# Patient Record
Sex: Female | Born: 1960 | Race: White | Hispanic: No | Marital: Single | Smoking: Never smoker
Health system: Southern US, Community
[De-identification: ages and names within clinical notes are randomized; demographics above are authoritative.]

## PROBLEM LIST (undated history)

## (undated) DIAGNOSIS — N201 Calculus of ureter: Secondary | ICD-10-CM

## (undated) DIAGNOSIS — I10 Essential (primary) hypertension: Secondary | ICD-10-CM

## (undated) DIAGNOSIS — Z8639 Personal history of other endocrine, nutritional and metabolic disease: Secondary | ICD-10-CM

## (undated) DIAGNOSIS — E119 Type 2 diabetes mellitus without complications: Secondary | ICD-10-CM

## (undated) HISTORY — PX: TONSILLECTOMY: SUR1361

## (undated) HISTORY — PX: TUBAL LIGATION: SHX77

## (undated) HISTORY — PX: JOINT REPLACEMENT: SHX530

## (undated) HISTORY — PX: CHOLECYSTECTOMY: SHX55

---

## 2012-02-11 ENCOUNTER — Other Ambulatory Visit: Payer: Self-pay | Admitting: Family Medicine

## 2012-02-11 ENCOUNTER — Ambulatory Visit
Admission: RE | Admit: 2012-02-11 | Discharge: 2012-02-11 | Disposition: A | Discharge status: Home / Self Care | Payer: Managed Care, Other (non HMO) | Source: Ambulatory Visit | Attending: Family Medicine | Admitting: Family Medicine

## 2012-02-11 DIAGNOSIS — M549 Dorsalgia, unspecified: Secondary | ICD-10-CM

## 2016-01-07 DIAGNOSIS — M75111 Incomplete rotator cuff tear or rupture of right shoulder, not specified as traumatic: Secondary | ICD-10-CM | POA: Diagnosis not present

## 2016-02-05 DIAGNOSIS — M75111 Incomplete rotator cuff tear or rupture of right shoulder, not specified as traumatic: Secondary | ICD-10-CM | POA: Diagnosis not present

## 2016-04-20 DIAGNOSIS — R05 Cough: Secondary | ICD-10-CM | POA: Diagnosis not present

## 2016-04-20 DIAGNOSIS — I10 Essential (primary) hypertension: Secondary | ICD-10-CM | POA: Diagnosis not present

## 2016-05-14 DIAGNOSIS — E119 Type 2 diabetes mellitus without complications: Secondary | ICD-10-CM | POA: Diagnosis not present

## 2016-05-14 DIAGNOSIS — E7800 Pure hypercholesterolemia, unspecified: Secondary | ICD-10-CM | POA: Diagnosis not present

## 2016-05-14 DIAGNOSIS — I10 Essential (primary) hypertension: Secondary | ICD-10-CM | POA: Diagnosis not present

## 2016-06-17 DIAGNOSIS — M1712 Unilateral primary osteoarthritis, left knee: Secondary | ICD-10-CM | POA: Diagnosis not present

## 2016-06-18 DIAGNOSIS — Z6841 Body Mass Index (BMI) 40.0 and over, adult: Secondary | ICD-10-CM | POA: Diagnosis not present

## 2016-06-18 DIAGNOSIS — I10 Essential (primary) hypertension: Secondary | ICD-10-CM | POA: Diagnosis not present

## 2016-06-18 DIAGNOSIS — E119 Type 2 diabetes mellitus without complications: Secondary | ICD-10-CM | POA: Diagnosis not present

## 2016-06-18 DIAGNOSIS — E785 Hyperlipidemia, unspecified: Secondary | ICD-10-CM | POA: Diagnosis not present

## 2016-07-14 DIAGNOSIS — Z136 Encounter for screening for cardiovascular disorders: Secondary | ICD-10-CM | POA: Diagnosis not present

## 2016-07-14 DIAGNOSIS — Z6841 Body Mass Index (BMI) 40.0 and over, adult: Secondary | ICD-10-CM | POA: Diagnosis not present

## 2016-07-14 DIAGNOSIS — I10 Essential (primary) hypertension: Secondary | ICD-10-CM | POA: Diagnosis not present

## 2016-07-14 DIAGNOSIS — E785 Hyperlipidemia, unspecified: Secondary | ICD-10-CM | POA: Diagnosis not present

## 2016-07-14 DIAGNOSIS — E119 Type 2 diabetes mellitus without complications: Secondary | ICD-10-CM | POA: Diagnosis not present

## 2016-07-20 DIAGNOSIS — R918 Other nonspecific abnormal finding of lung field: Secondary | ICD-10-CM | POA: Diagnosis not present

## 2016-07-20 DIAGNOSIS — R938 Abnormal findings on diagnostic imaging of other specified body structures: Secondary | ICD-10-CM | POA: Diagnosis not present

## 2016-08-11 DIAGNOSIS — E119 Type 2 diabetes mellitus without complications: Secondary | ICD-10-CM | POA: Diagnosis not present

## 2016-08-11 DIAGNOSIS — I10 Essential (primary) hypertension: Secondary | ICD-10-CM | POA: Diagnosis not present

## 2016-08-11 DIAGNOSIS — Z713 Dietary counseling and surveillance: Secondary | ICD-10-CM | POA: Diagnosis not present

## 2016-08-11 DIAGNOSIS — E7800 Pure hypercholesterolemia, unspecified: Secondary | ICD-10-CM | POA: Diagnosis not present

## 2016-08-13 DIAGNOSIS — Z6841 Body Mass Index (BMI) 40.0 and over, adult: Secondary | ICD-10-CM | POA: Diagnosis not present

## 2016-08-13 DIAGNOSIS — Z713 Dietary counseling and surveillance: Secondary | ICD-10-CM | POA: Diagnosis not present

## 2016-09-18 DIAGNOSIS — Z713 Dietary counseling and surveillance: Secondary | ICD-10-CM | POA: Diagnosis not present

## 2016-09-18 DIAGNOSIS — Z6841 Body Mass Index (BMI) 40.0 and over, adult: Secondary | ICD-10-CM | POA: Diagnosis not present

## 2016-10-01 DIAGNOSIS — Z6841 Body Mass Index (BMI) 40.0 and over, adult: Secondary | ICD-10-CM | POA: Diagnosis not present

## 2016-10-01 DIAGNOSIS — Z713 Dietary counseling and surveillance: Secondary | ICD-10-CM | POA: Diagnosis not present

## 2016-10-26 DIAGNOSIS — F54 Psychological and behavioral factors associated with disorders or diseases classified elsewhere: Secondary | ICD-10-CM | POA: Diagnosis not present

## 2016-10-26 DIAGNOSIS — Z7189 Other specified counseling: Secondary | ICD-10-CM | POA: Diagnosis not present

## 2016-10-29 DIAGNOSIS — E119 Type 2 diabetes mellitus without complications: Secondary | ICD-10-CM | POA: Diagnosis not present

## 2016-10-29 DIAGNOSIS — Z6841 Body Mass Index (BMI) 40.0 and over, adult: Secondary | ICD-10-CM | POA: Diagnosis not present

## 2016-10-29 DIAGNOSIS — I10 Essential (primary) hypertension: Secondary | ICD-10-CM | POA: Diagnosis not present

## 2016-10-29 DIAGNOSIS — Z96651 Presence of right artificial knee joint: Secondary | ICD-10-CM | POA: Diagnosis not present

## 2016-11-17 DIAGNOSIS — E785 Hyperlipidemia, unspecified: Secondary | ICD-10-CM | POA: Diagnosis not present

## 2016-11-17 DIAGNOSIS — Z6841 Body Mass Index (BMI) 40.0 and over, adult: Secondary | ICD-10-CM | POA: Diagnosis not present

## 2016-11-17 DIAGNOSIS — E119 Type 2 diabetes mellitus without complications: Secondary | ICD-10-CM | POA: Diagnosis not present

## 2016-11-17 DIAGNOSIS — I10 Essential (primary) hypertension: Secondary | ICD-10-CM | POA: Diagnosis not present

## 2016-11-17 DIAGNOSIS — Z01818 Encounter for other preprocedural examination: Secondary | ICD-10-CM | POA: Diagnosis not present

## 2016-11-18 DIAGNOSIS — I10 Essential (primary) hypertension: Secondary | ICD-10-CM | POA: Diagnosis not present

## 2016-11-18 DIAGNOSIS — Z01818 Encounter for other preprocedural examination: Secondary | ICD-10-CM | POA: Diagnosis not present

## 2016-11-18 DIAGNOSIS — Z6841 Body Mass Index (BMI) 40.0 and over, adult: Secondary | ICD-10-CM | POA: Diagnosis not present

## 2016-11-18 DIAGNOSIS — Z96651 Presence of right artificial knee joint: Secondary | ICD-10-CM | POA: Diagnosis not present

## 2016-11-18 DIAGNOSIS — K219 Gastro-esophageal reflux disease without esophagitis: Secondary | ICD-10-CM | POA: Diagnosis not present

## 2016-11-18 DIAGNOSIS — Z79899 Other long term (current) drug therapy: Secondary | ICD-10-CM | POA: Diagnosis not present

## 2016-11-18 DIAGNOSIS — E11618 Type 2 diabetes mellitus with other diabetic arthropathy: Secondary | ICD-10-CM | POA: Diagnosis not present

## 2016-11-18 DIAGNOSIS — Z794 Long term (current) use of insulin: Secondary | ICD-10-CM | POA: Diagnosis not present

## 2016-11-18 DIAGNOSIS — Z791 Long term (current) use of non-steroidal anti-inflammatories (NSAID): Secondary | ICD-10-CM | POA: Diagnosis not present

## 2016-11-18 DIAGNOSIS — Z9049 Acquired absence of other specified parts of digestive tract: Secondary | ICD-10-CM | POA: Diagnosis not present

## 2016-11-18 DIAGNOSIS — E785 Hyperlipidemia, unspecified: Secondary | ICD-10-CM | POA: Diagnosis not present

## 2016-11-22 HISTORY — PX: GASTRIC BYPASS: SHX52

## 2016-11-30 DIAGNOSIS — Z96651 Presence of right artificial knee joint: Secondary | ICD-10-CM | POA: Diagnosis not present

## 2016-11-30 DIAGNOSIS — K660 Peritoneal adhesions (postprocedural) (postinfection): Secondary | ICD-10-CM | POA: Diagnosis not present

## 2016-11-30 DIAGNOSIS — Z6841 Body Mass Index (BMI) 40.0 and over, adult: Secondary | ICD-10-CM | POA: Diagnosis not present

## 2016-11-30 DIAGNOSIS — Z791 Long term (current) use of non-steroidal anti-inflammatories (NSAID): Secondary | ICD-10-CM | POA: Diagnosis not present

## 2016-11-30 DIAGNOSIS — Z79899 Other long term (current) drug therapy: Secondary | ICD-10-CM | POA: Diagnosis not present

## 2016-11-30 DIAGNOSIS — I10 Essential (primary) hypertension: Secondary | ICD-10-CM | POA: Diagnosis not present

## 2016-11-30 DIAGNOSIS — E119 Type 2 diabetes mellitus without complications: Secondary | ICD-10-CM | POA: Diagnosis not present

## 2016-11-30 DIAGNOSIS — E785 Hyperlipidemia, unspecified: Secondary | ICD-10-CM | POA: Diagnosis not present

## 2016-12-08 DIAGNOSIS — E119 Type 2 diabetes mellitus without complications: Secondary | ICD-10-CM | POA: Diagnosis not present

## 2016-12-08 DIAGNOSIS — I10 Essential (primary) hypertension: Secondary | ICD-10-CM | POA: Diagnosis not present

## 2016-12-08 DIAGNOSIS — E7800 Pure hypercholesterolemia, unspecified: Secondary | ICD-10-CM | POA: Diagnosis not present

## 2016-12-10 DIAGNOSIS — E860 Dehydration: Secondary | ICD-10-CM | POA: Diagnosis not present

## 2016-12-17 DIAGNOSIS — Z6841 Body Mass Index (BMI) 40.0 and over, adult: Secondary | ICD-10-CM | POA: Diagnosis not present

## 2016-12-17 DIAGNOSIS — Z713 Dietary counseling and surveillance: Secondary | ICD-10-CM | POA: Diagnosis not present

## 2017-02-18 DIAGNOSIS — I10 Essential (primary) hypertension: Secondary | ICD-10-CM | POA: Diagnosis not present

## 2017-02-18 DIAGNOSIS — R001 Bradycardia, unspecified: Secondary | ICD-10-CM | POA: Diagnosis not present

## 2017-03-01 DIAGNOSIS — Z713 Dietary counseling and surveillance: Secondary | ICD-10-CM | POA: Diagnosis not present

## 2017-03-01 DIAGNOSIS — Z6841 Body Mass Index (BMI) 40.0 and over, adult: Secondary | ICD-10-CM | POA: Diagnosis not present

## 2017-03-18 DIAGNOSIS — E7800 Pure hypercholesterolemia, unspecified: Secondary | ICD-10-CM | POA: Diagnosis not present

## 2017-03-18 DIAGNOSIS — E1165 Type 2 diabetes mellitus with hyperglycemia: Secondary | ICD-10-CM | POA: Diagnosis not present

## 2017-03-18 DIAGNOSIS — K912 Postsurgical malabsorption, not elsewhere classified: Secondary | ICD-10-CM | POA: Diagnosis not present

## 2017-03-18 DIAGNOSIS — Z0000 Encounter for general adult medical examination without abnormal findings: Secondary | ICD-10-CM | POA: Diagnosis not present

## 2017-04-07 DIAGNOSIS — M1712 Unilateral primary osteoarthritis, left knee: Secondary | ICD-10-CM | POA: Diagnosis not present

## 2017-05-17 DIAGNOSIS — K912 Postsurgical malabsorption, not elsewhere classified: Secondary | ICD-10-CM | POA: Diagnosis not present

## 2017-05-17 DIAGNOSIS — Z9884 Bariatric surgery status: Secondary | ICD-10-CM | POA: Diagnosis not present

## 2017-06-10 DIAGNOSIS — I10 Essential (primary) hypertension: Secondary | ICD-10-CM | POA: Diagnosis not present

## 2017-06-10 DIAGNOSIS — E7800 Pure hypercholesterolemia, unspecified: Secondary | ICD-10-CM | POA: Diagnosis not present

## 2017-06-10 DIAGNOSIS — E1169 Type 2 diabetes mellitus with other specified complication: Secondary | ICD-10-CM | POA: Diagnosis not present

## 2017-06-30 DIAGNOSIS — E785 Hyperlipidemia, unspecified: Secondary | ICD-10-CM | POA: Diagnosis not present

## 2017-06-30 DIAGNOSIS — I10 Essential (primary) hypertension: Secondary | ICD-10-CM | POA: Diagnosis not present

## 2017-06-30 DIAGNOSIS — Z9884 Bariatric surgery status: Secondary | ICD-10-CM | POA: Diagnosis not present

## 2017-06-30 DIAGNOSIS — E119 Type 2 diabetes mellitus without complications: Secondary | ICD-10-CM | POA: Diagnosis not present

## 2017-07-12 DIAGNOSIS — I10 Essential (primary) hypertension: Secondary | ICD-10-CM | POA: Diagnosis not present

## 2017-07-12 DIAGNOSIS — Z01818 Encounter for other preprocedural examination: Secondary | ICD-10-CM | POA: Diagnosis not present

## 2017-07-12 DIAGNOSIS — Z8639 Personal history of other endocrine, nutritional and metabolic disease: Secondary | ICD-10-CM | POA: Diagnosis not present

## 2017-07-12 DIAGNOSIS — M1712 Unilateral primary osteoarthritis, left knee: Secondary | ICD-10-CM | POA: Diagnosis not present

## 2017-07-12 DIAGNOSIS — Z79899 Other long term (current) drug therapy: Secondary | ICD-10-CM | POA: Diagnosis not present

## 2017-07-12 DIAGNOSIS — Z9884 Bariatric surgery status: Secondary | ICD-10-CM | POA: Diagnosis not present

## 2017-07-12 DIAGNOSIS — Z87898 Personal history of other specified conditions: Secondary | ICD-10-CM | POA: Diagnosis not present

## 2017-07-23 DIAGNOSIS — R0789 Other chest pain: Secondary | ICD-10-CM | POA: Diagnosis not present

## 2017-07-23 DIAGNOSIS — E119 Type 2 diabetes mellitus without complications: Secondary | ICD-10-CM | POA: Diagnosis not present

## 2017-07-23 DIAGNOSIS — Z6831 Body mass index (BMI) 31.0-31.9, adult: Secondary | ICD-10-CM | POA: Diagnosis not present

## 2017-07-23 DIAGNOSIS — I959 Hypotension, unspecified: Secondary | ICD-10-CM | POA: Diagnosis not present

## 2017-07-23 DIAGNOSIS — Z9884 Bariatric surgery status: Secondary | ICD-10-CM | POA: Diagnosis not present

## 2017-07-23 DIAGNOSIS — R001 Bradycardia, unspecified: Secondary | ICD-10-CM | POA: Diagnosis not present

## 2017-07-23 DIAGNOSIS — R55 Syncope and collapse: Secondary | ICD-10-CM | POA: Diagnosis not present

## 2017-07-23 DIAGNOSIS — Z96652 Presence of left artificial knee joint: Secondary | ICD-10-CM | POA: Diagnosis not present

## 2017-07-23 DIAGNOSIS — Z79899 Other long term (current) drug therapy: Secondary | ICD-10-CM | POA: Diagnosis not present

## 2017-07-23 DIAGNOSIS — E669 Obesity, unspecified: Secondary | ICD-10-CM | POA: Diagnosis not present

## 2017-07-23 DIAGNOSIS — R079 Chest pain, unspecified: Secondary | ICD-10-CM | POA: Diagnosis not present

## 2017-07-23 DIAGNOSIS — Z96651 Presence of right artificial knee joint: Secondary | ICD-10-CM | POA: Diagnosis not present

## 2017-07-23 DIAGNOSIS — Z471 Aftercare following joint replacement surgery: Secondary | ICD-10-CM | POA: Diagnosis not present

## 2017-07-23 DIAGNOSIS — M1712 Unilateral primary osteoarthritis, left knee: Secondary | ICD-10-CM | POA: Diagnosis not present

## 2017-07-23 DIAGNOSIS — E785 Hyperlipidemia, unspecified: Secondary | ICD-10-CM | POA: Diagnosis not present

## 2017-07-23 DIAGNOSIS — I10 Essential (primary) hypertension: Secondary | ICD-10-CM | POA: Diagnosis not present

## 2017-07-24 DIAGNOSIS — R079 Chest pain, unspecified: Secondary | ICD-10-CM | POA: Diagnosis not present

## 2017-07-24 DIAGNOSIS — I10 Essential (primary) hypertension: Secondary | ICD-10-CM | POA: Diagnosis not present

## 2017-07-24 DIAGNOSIS — R55 Syncope and collapse: Secondary | ICD-10-CM | POA: Diagnosis not present

## 2017-07-24 DIAGNOSIS — M1712 Unilateral primary osteoarthritis, left knee: Secondary | ICD-10-CM | POA: Diagnosis not present

## 2017-07-25 DIAGNOSIS — M1712 Unilateral primary osteoarthritis, left knee: Secondary | ICD-10-CM | POA: Diagnosis not present

## 2017-07-25 DIAGNOSIS — R55 Syncope and collapse: Secondary | ICD-10-CM | POA: Diagnosis not present

## 2017-07-25 DIAGNOSIS — I10 Essential (primary) hypertension: Secondary | ICD-10-CM | POA: Diagnosis not present

## 2017-07-25 DIAGNOSIS — R079 Chest pain, unspecified: Secondary | ICD-10-CM | POA: Diagnosis not present

## 2017-07-26 DIAGNOSIS — E119 Type 2 diabetes mellitus without complications: Secondary | ICD-10-CM | POA: Diagnosis not present

## 2017-07-26 DIAGNOSIS — Z96653 Presence of artificial knee joint, bilateral: Secondary | ICD-10-CM | POA: Diagnosis not present

## 2017-07-26 DIAGNOSIS — Z471 Aftercare following joint replacement surgery: Secondary | ICD-10-CM | POA: Diagnosis not present

## 2017-07-26 DIAGNOSIS — E785 Hyperlipidemia, unspecified: Secondary | ICD-10-CM | POA: Diagnosis not present

## 2017-07-26 DIAGNOSIS — I10 Essential (primary) hypertension: Secondary | ICD-10-CM | POA: Diagnosis not present

## 2017-07-28 DIAGNOSIS — Z471 Aftercare following joint replacement surgery: Secondary | ICD-10-CM | POA: Diagnosis not present

## 2017-07-28 DIAGNOSIS — E785 Hyperlipidemia, unspecified: Secondary | ICD-10-CM | POA: Diagnosis not present

## 2017-07-28 DIAGNOSIS — E119 Type 2 diabetes mellitus without complications: Secondary | ICD-10-CM | POA: Diagnosis not present

## 2017-07-28 DIAGNOSIS — I10 Essential (primary) hypertension: Secondary | ICD-10-CM | POA: Diagnosis not present

## 2017-07-28 DIAGNOSIS — Z96653 Presence of artificial knee joint, bilateral: Secondary | ICD-10-CM | POA: Diagnosis not present

## 2017-07-30 DIAGNOSIS — E119 Type 2 diabetes mellitus without complications: Secondary | ICD-10-CM | POA: Diagnosis not present

## 2017-07-30 DIAGNOSIS — E785 Hyperlipidemia, unspecified: Secondary | ICD-10-CM | POA: Diagnosis not present

## 2017-07-30 DIAGNOSIS — I10 Essential (primary) hypertension: Secondary | ICD-10-CM | POA: Diagnosis not present

## 2017-07-30 DIAGNOSIS — Z471 Aftercare following joint replacement surgery: Secondary | ICD-10-CM | POA: Diagnosis not present

## 2017-07-30 DIAGNOSIS — Z96653 Presence of artificial knee joint, bilateral: Secondary | ICD-10-CM | POA: Diagnosis not present

## 2017-08-03 DIAGNOSIS — E119 Type 2 diabetes mellitus without complications: Secondary | ICD-10-CM | POA: Diagnosis not present

## 2017-08-03 DIAGNOSIS — I10 Essential (primary) hypertension: Secondary | ICD-10-CM | POA: Diagnosis not present

## 2017-08-03 DIAGNOSIS — Z471 Aftercare following joint replacement surgery: Secondary | ICD-10-CM | POA: Diagnosis not present

## 2017-08-03 DIAGNOSIS — Z96653 Presence of artificial knee joint, bilateral: Secondary | ICD-10-CM | POA: Diagnosis not present

## 2017-08-03 DIAGNOSIS — E785 Hyperlipidemia, unspecified: Secondary | ICD-10-CM | POA: Diagnosis not present

## 2017-08-04 DIAGNOSIS — E119 Type 2 diabetes mellitus without complications: Secondary | ICD-10-CM | POA: Diagnosis not present

## 2017-08-04 DIAGNOSIS — Z471 Aftercare following joint replacement surgery: Secondary | ICD-10-CM | POA: Diagnosis not present

## 2017-08-04 DIAGNOSIS — I10 Essential (primary) hypertension: Secondary | ICD-10-CM | POA: Diagnosis not present

## 2017-08-04 DIAGNOSIS — Z96653 Presence of artificial knee joint, bilateral: Secondary | ICD-10-CM | POA: Diagnosis not present

## 2017-08-04 DIAGNOSIS — E785 Hyperlipidemia, unspecified: Secondary | ICD-10-CM | POA: Diagnosis not present

## 2017-08-06 DIAGNOSIS — E785 Hyperlipidemia, unspecified: Secondary | ICD-10-CM | POA: Diagnosis not present

## 2017-08-06 DIAGNOSIS — Z471 Aftercare following joint replacement surgery: Secondary | ICD-10-CM | POA: Diagnosis not present

## 2017-08-06 DIAGNOSIS — E119 Type 2 diabetes mellitus without complications: Secondary | ICD-10-CM | POA: Diagnosis not present

## 2017-08-06 DIAGNOSIS — Z96653 Presence of artificial knee joint, bilateral: Secondary | ICD-10-CM | POA: Diagnosis not present

## 2017-08-06 DIAGNOSIS — I10 Essential (primary) hypertension: Secondary | ICD-10-CM | POA: Diagnosis not present

## 2017-08-09 DIAGNOSIS — Z471 Aftercare following joint replacement surgery: Secondary | ICD-10-CM | POA: Diagnosis not present

## 2017-08-09 DIAGNOSIS — E119 Type 2 diabetes mellitus without complications: Secondary | ICD-10-CM | POA: Diagnosis not present

## 2017-08-09 DIAGNOSIS — E785 Hyperlipidemia, unspecified: Secondary | ICD-10-CM | POA: Diagnosis not present

## 2017-08-09 DIAGNOSIS — I10 Essential (primary) hypertension: Secondary | ICD-10-CM | POA: Diagnosis not present

## 2017-08-09 DIAGNOSIS — Z96653 Presence of artificial knee joint, bilateral: Secondary | ICD-10-CM | POA: Diagnosis not present

## 2017-08-11 DIAGNOSIS — I10 Essential (primary) hypertension: Secondary | ICD-10-CM | POA: Diagnosis not present

## 2017-08-11 DIAGNOSIS — Z471 Aftercare following joint replacement surgery: Secondary | ICD-10-CM | POA: Diagnosis not present

## 2017-08-11 DIAGNOSIS — Z96653 Presence of artificial knee joint, bilateral: Secondary | ICD-10-CM | POA: Diagnosis not present

## 2017-08-11 DIAGNOSIS — E785 Hyperlipidemia, unspecified: Secondary | ICD-10-CM | POA: Diagnosis not present

## 2017-08-11 DIAGNOSIS — E119 Type 2 diabetes mellitus without complications: Secondary | ICD-10-CM | POA: Diagnosis not present

## 2017-08-12 DIAGNOSIS — M25562 Pain in left knee: Secondary | ICD-10-CM | POA: Diagnosis not present

## 2017-08-12 DIAGNOSIS — R6889 Other general symptoms and signs: Secondary | ICD-10-CM | POA: Diagnosis not present

## 2017-08-18 DIAGNOSIS — M25562 Pain in left knee: Secondary | ICD-10-CM | POA: Diagnosis not present

## 2017-08-18 DIAGNOSIS — R6889 Other general symptoms and signs: Secondary | ICD-10-CM | POA: Diagnosis not present

## 2017-08-20 DIAGNOSIS — R6889 Other general symptoms and signs: Secondary | ICD-10-CM | POA: Diagnosis not present

## 2017-08-20 DIAGNOSIS — M25562 Pain in left knee: Secondary | ICD-10-CM | POA: Diagnosis not present

## 2017-08-25 DIAGNOSIS — M25562 Pain in left knee: Secondary | ICD-10-CM | POA: Diagnosis not present

## 2017-08-25 DIAGNOSIS — R6889 Other general symptoms and signs: Secondary | ICD-10-CM | POA: Diagnosis not present

## 2017-08-27 DIAGNOSIS — R6889 Other general symptoms and signs: Secondary | ICD-10-CM | POA: Diagnosis not present

## 2017-08-27 DIAGNOSIS — M25562 Pain in left knee: Secondary | ICD-10-CM | POA: Diagnosis not present

## 2017-08-31 DIAGNOSIS — R6889 Other general symptoms and signs: Secondary | ICD-10-CM | POA: Diagnosis not present

## 2017-08-31 DIAGNOSIS — M25562 Pain in left knee: Secondary | ICD-10-CM | POA: Diagnosis not present

## 2017-09-01 DIAGNOSIS — M25562 Pain in left knee: Secondary | ICD-10-CM | POA: Diagnosis not present

## 2017-09-02 DIAGNOSIS — R6889 Other general symptoms and signs: Secondary | ICD-10-CM | POA: Diagnosis not present

## 2017-09-02 DIAGNOSIS — M25562 Pain in left knee: Secondary | ICD-10-CM | POA: Diagnosis not present

## 2017-09-07 DIAGNOSIS — R6889 Other general symptoms and signs: Secondary | ICD-10-CM | POA: Diagnosis not present

## 2017-09-07 DIAGNOSIS — M25562 Pain in left knee: Secondary | ICD-10-CM | POA: Diagnosis not present

## 2017-09-09 DIAGNOSIS — M25562 Pain in left knee: Secondary | ICD-10-CM | POA: Diagnosis not present

## 2017-09-09 DIAGNOSIS — R6889 Other general symptoms and signs: Secondary | ICD-10-CM | POA: Diagnosis not present

## 2017-09-13 DIAGNOSIS — R6889 Other general symptoms and signs: Secondary | ICD-10-CM | POA: Diagnosis not present

## 2017-09-13 DIAGNOSIS — M25562 Pain in left knee: Secondary | ICD-10-CM | POA: Diagnosis not present

## 2017-09-15 DIAGNOSIS — R6889 Other general symptoms and signs: Secondary | ICD-10-CM | POA: Diagnosis not present

## 2017-09-15 DIAGNOSIS — M25562 Pain in left knee: Secondary | ICD-10-CM | POA: Diagnosis not present

## 2017-09-20 DIAGNOSIS — M25562 Pain in left knee: Secondary | ICD-10-CM | POA: Diagnosis not present

## 2017-09-20 DIAGNOSIS — R6889 Other general symptoms and signs: Secondary | ICD-10-CM | POA: Diagnosis not present

## 2017-09-22 DIAGNOSIS — R6889 Other general symptoms and signs: Secondary | ICD-10-CM | POA: Diagnosis not present

## 2017-09-22 DIAGNOSIS — M25562 Pain in left knee: Secondary | ICD-10-CM | POA: Diagnosis not present

## 2017-09-28 DIAGNOSIS — R6889 Other general symptoms and signs: Secondary | ICD-10-CM | POA: Diagnosis not present

## 2017-09-28 DIAGNOSIS — M25562 Pain in left knee: Secondary | ICD-10-CM | POA: Diagnosis not present

## 2017-09-30 DIAGNOSIS — R6889 Other general symptoms and signs: Secondary | ICD-10-CM | POA: Diagnosis not present

## 2017-09-30 DIAGNOSIS — M25562 Pain in left knee: Secondary | ICD-10-CM | POA: Diagnosis not present

## 2017-10-13 DIAGNOSIS — M25562 Pain in left knee: Secondary | ICD-10-CM | POA: Diagnosis not present

## 2017-10-25 DIAGNOSIS — Z96652 Presence of left artificial knee joint: Secondary | ICD-10-CM | POA: Diagnosis not present

## 2017-10-25 DIAGNOSIS — M25562 Pain in left knee: Secondary | ICD-10-CM | POA: Diagnosis not present

## 2017-10-25 DIAGNOSIS — E119 Type 2 diabetes mellitus without complications: Secondary | ICD-10-CM | POA: Diagnosis not present

## 2017-10-25 DIAGNOSIS — I10 Essential (primary) hypertension: Secondary | ICD-10-CM | POA: Diagnosis not present

## 2017-10-25 DIAGNOSIS — E785 Hyperlipidemia, unspecified: Secondary | ICD-10-CM | POA: Diagnosis not present

## 2017-10-25 DIAGNOSIS — Z79899 Other long term (current) drug therapy: Secondary | ICD-10-CM | POA: Diagnosis not present

## 2017-10-25 DIAGNOSIS — M24662 Ankylosis, left knee: Secondary | ICD-10-CM | POA: Diagnosis not present

## 2017-10-26 DIAGNOSIS — M24662 Ankylosis, left knee: Secondary | ICD-10-CM | POA: Diagnosis not present

## 2017-10-26 DIAGNOSIS — M25562 Pain in left knee: Secondary | ICD-10-CM | POA: Diagnosis not present

## 2017-10-26 DIAGNOSIS — Z96652 Presence of left artificial knee joint: Secondary | ICD-10-CM | POA: Diagnosis not present

## 2017-10-29 DIAGNOSIS — Z96652 Presence of left artificial knee joint: Secondary | ICD-10-CM | POA: Diagnosis not present

## 2017-10-29 DIAGNOSIS — M25562 Pain in left knee: Secondary | ICD-10-CM | POA: Diagnosis not present

## 2017-10-29 DIAGNOSIS — M24662 Ankylosis, left knee: Secondary | ICD-10-CM | POA: Diagnosis not present

## 2017-11-03 DIAGNOSIS — Z96652 Presence of left artificial knee joint: Secondary | ICD-10-CM | POA: Diagnosis not present

## 2017-11-03 DIAGNOSIS — M24662 Ankylosis, left knee: Secondary | ICD-10-CM | POA: Diagnosis not present

## 2017-11-03 DIAGNOSIS — M25562 Pain in left knee: Secondary | ICD-10-CM | POA: Diagnosis not present

## 2017-11-05 DIAGNOSIS — M24662 Ankylosis, left knee: Secondary | ICD-10-CM | POA: Diagnosis not present

## 2017-11-05 DIAGNOSIS — Z96652 Presence of left artificial knee joint: Secondary | ICD-10-CM | POA: Diagnosis not present

## 2017-11-05 DIAGNOSIS — M25562 Pain in left knee: Secondary | ICD-10-CM | POA: Diagnosis not present

## 2017-11-08 DIAGNOSIS — M24662 Ankylosis, left knee: Secondary | ICD-10-CM | POA: Diagnosis not present

## 2017-11-08 DIAGNOSIS — Z96652 Presence of left artificial knee joint: Secondary | ICD-10-CM | POA: Diagnosis not present

## 2017-11-08 DIAGNOSIS — M25562 Pain in left knee: Secondary | ICD-10-CM | POA: Diagnosis not present

## 2017-11-09 DIAGNOSIS — M25562 Pain in left knee: Secondary | ICD-10-CM | POA: Diagnosis not present

## 2017-11-09 DIAGNOSIS — M24662 Ankylosis, left knee: Secondary | ICD-10-CM | POA: Diagnosis not present

## 2017-11-09 DIAGNOSIS — Z96652 Presence of left artificial knee joint: Secondary | ICD-10-CM | POA: Diagnosis not present

## 2017-11-12 DIAGNOSIS — M24662 Ankylosis, left knee: Secondary | ICD-10-CM | POA: Diagnosis not present

## 2017-11-12 DIAGNOSIS — Z96652 Presence of left artificial knee joint: Secondary | ICD-10-CM | POA: Diagnosis not present

## 2017-11-12 DIAGNOSIS — M25562 Pain in left knee: Secondary | ICD-10-CM | POA: Diagnosis not present

## 2017-11-15 DIAGNOSIS — M24662 Ankylosis, left knee: Secondary | ICD-10-CM | POA: Diagnosis not present

## 2017-11-15 DIAGNOSIS — Z96652 Presence of left artificial knee joint: Secondary | ICD-10-CM | POA: Diagnosis not present

## 2017-11-15 DIAGNOSIS — M25562 Pain in left knee: Secondary | ICD-10-CM | POA: Diagnosis not present

## 2017-11-16 DIAGNOSIS — Z96652 Presence of left artificial knee joint: Secondary | ICD-10-CM | POA: Diagnosis not present

## 2017-11-16 DIAGNOSIS — M24662 Ankylosis, left knee: Secondary | ICD-10-CM | POA: Diagnosis not present

## 2017-11-16 DIAGNOSIS — M25562 Pain in left knee: Secondary | ICD-10-CM | POA: Diagnosis not present

## 2017-11-18 DIAGNOSIS — M24662 Ankylosis, left knee: Secondary | ICD-10-CM | POA: Diagnosis not present

## 2017-11-18 DIAGNOSIS — Z96652 Presence of left artificial knee joint: Secondary | ICD-10-CM | POA: Diagnosis not present

## 2017-11-18 DIAGNOSIS — M25562 Pain in left knee: Secondary | ICD-10-CM | POA: Diagnosis not present

## 2017-11-19 DIAGNOSIS — K912 Postsurgical malabsorption, not elsewhere classified: Secondary | ICD-10-CM | POA: Diagnosis not present

## 2017-11-19 DIAGNOSIS — Z9884 Bariatric surgery status: Secondary | ICD-10-CM | POA: Diagnosis not present

## 2017-11-22 DIAGNOSIS — M25562 Pain in left knee: Secondary | ICD-10-CM | POA: Diagnosis not present

## 2017-11-22 DIAGNOSIS — M24662 Ankylosis, left knee: Secondary | ICD-10-CM | POA: Diagnosis not present

## 2017-11-22 DIAGNOSIS — Z96652 Presence of left artificial knee joint: Secondary | ICD-10-CM | POA: Diagnosis not present

## 2017-11-23 DIAGNOSIS — M25562 Pain in left knee: Secondary | ICD-10-CM | POA: Diagnosis not present

## 2017-11-23 DIAGNOSIS — M24662 Ankylosis, left knee: Secondary | ICD-10-CM | POA: Diagnosis not present

## 2017-11-23 DIAGNOSIS — Z96652 Presence of left artificial knee joint: Secondary | ICD-10-CM | POA: Diagnosis not present

## 2017-11-25 DIAGNOSIS — E119 Type 2 diabetes mellitus without complications: Secondary | ICD-10-CM | POA: Diagnosis not present

## 2017-11-25 DIAGNOSIS — I10 Essential (primary) hypertension: Secondary | ICD-10-CM | POA: Diagnosis not present

## 2017-11-25 DIAGNOSIS — M10072 Idiopathic gout, left ankle and foot: Secondary | ICD-10-CM | POA: Diagnosis not present

## 2017-11-25 DIAGNOSIS — M79675 Pain in left toe(s): Secondary | ICD-10-CM | POA: Diagnosis not present

## 2017-11-25 DIAGNOSIS — M79673 Pain in unspecified foot: Secondary | ICD-10-CM | POA: Diagnosis not present

## 2017-11-29 DIAGNOSIS — Z96652 Presence of left artificial knee joint: Secondary | ICD-10-CM | POA: Diagnosis not present

## 2017-11-29 DIAGNOSIS — M25562 Pain in left knee: Secondary | ICD-10-CM | POA: Diagnosis not present

## 2017-11-29 DIAGNOSIS — M24662 Ankylosis, left knee: Secondary | ICD-10-CM | POA: Diagnosis not present

## 2017-11-30 DIAGNOSIS — Z96651 Presence of right artificial knee joint: Secondary | ICD-10-CM | POA: Diagnosis not present

## 2017-11-30 DIAGNOSIS — E119 Type 2 diabetes mellitus without complications: Secondary | ICD-10-CM | POA: Diagnosis not present

## 2017-11-30 DIAGNOSIS — Z9884 Bariatric surgery status: Secondary | ICD-10-CM | POA: Diagnosis not present

## 2017-11-30 DIAGNOSIS — I10 Essential (primary) hypertension: Secondary | ICD-10-CM | POA: Diagnosis not present

## 2017-11-30 DIAGNOSIS — R945 Abnormal results of liver function studies: Secondary | ICD-10-CM | POA: Diagnosis not present

## 2017-12-02 DIAGNOSIS — Z96652 Presence of left artificial knee joint: Secondary | ICD-10-CM | POA: Diagnosis not present

## 2017-12-02 DIAGNOSIS — M24662 Ankylosis, left knee: Secondary | ICD-10-CM | POA: Diagnosis not present

## 2017-12-02 DIAGNOSIS — M25562 Pain in left knee: Secondary | ICD-10-CM | POA: Diagnosis not present

## 2017-12-06 DIAGNOSIS — M24662 Ankylosis, left knee: Secondary | ICD-10-CM | POA: Diagnosis not present

## 2017-12-06 DIAGNOSIS — Z96652 Presence of left artificial knee joint: Secondary | ICD-10-CM | POA: Diagnosis not present

## 2017-12-06 DIAGNOSIS — M25562 Pain in left knee: Secondary | ICD-10-CM | POA: Diagnosis not present

## 2017-12-07 DIAGNOSIS — R300 Dysuria: Secondary | ICD-10-CM | POA: Diagnosis not present

## 2017-12-07 DIAGNOSIS — M24662 Ankylosis, left knee: Secondary | ICD-10-CM | POA: Diagnosis not present

## 2017-12-07 DIAGNOSIS — Z23 Encounter for immunization: Secondary | ICD-10-CM | POA: Diagnosis not present

## 2017-12-07 DIAGNOSIS — M25562 Pain in left knee: Secondary | ICD-10-CM | POA: Diagnosis not present

## 2017-12-07 DIAGNOSIS — Z7189 Other specified counseling: Secondary | ICD-10-CM | POA: Diagnosis not present

## 2017-12-07 DIAGNOSIS — M10472 Other secondary gout, left ankle and foot: Secondary | ICD-10-CM | POA: Diagnosis not present

## 2017-12-07 DIAGNOSIS — Z96652 Presence of left artificial knee joint: Secondary | ICD-10-CM | POA: Diagnosis not present

## 2017-12-07 DIAGNOSIS — Z6828 Body mass index (BMI) 28.0-28.9, adult: Secondary | ICD-10-CM | POA: Diagnosis not present

## 2017-12-09 DIAGNOSIS — M25562 Pain in left knee: Secondary | ICD-10-CM | POA: Diagnosis not present

## 2017-12-09 DIAGNOSIS — Z96652 Presence of left artificial knee joint: Secondary | ICD-10-CM | POA: Diagnosis not present

## 2017-12-09 DIAGNOSIS — M24662 Ankylosis, left knee: Secondary | ICD-10-CM | POA: Diagnosis not present

## 2017-12-21 DIAGNOSIS — M25562 Pain in left knee: Secondary | ICD-10-CM | POA: Diagnosis not present

## 2017-12-21 DIAGNOSIS — M24662 Ankylosis, left knee: Secondary | ICD-10-CM | POA: Diagnosis not present

## 2017-12-21 DIAGNOSIS — Z96652 Presence of left artificial knee joint: Secondary | ICD-10-CM | POA: Diagnosis not present

## 2017-12-31 DIAGNOSIS — M24662 Ankylosis, left knee: Secondary | ICD-10-CM | POA: Diagnosis not present

## 2017-12-31 DIAGNOSIS — Y802 Prosthetic and other implants, materials and accessory physical medicine devices associated with adverse incidents: Secondary | ICD-10-CM | POA: Diagnosis not present

## 2017-12-31 DIAGNOSIS — M25562 Pain in left knee: Secondary | ICD-10-CM | POA: Diagnosis not present

## 2017-12-31 DIAGNOSIS — Z96652 Presence of left artificial knee joint: Secondary | ICD-10-CM | POA: Diagnosis not present

## 2017-12-31 DIAGNOSIS — T8482XD Fibrosis due to internal orthopedic prosthetic devices, implants and grafts, subsequent encounter: Secondary | ICD-10-CM | POA: Diagnosis not present

## 2018-02-23 DIAGNOSIS — R300 Dysuria: Secondary | ICD-10-CM | POA: Diagnosis not present

## 2018-02-23 DIAGNOSIS — N3000 Acute cystitis without hematuria: Secondary | ICD-10-CM | POA: Diagnosis not present

## 2018-12-01 ENCOUNTER — Ambulatory Visit
Admission: RE | Admit: 2018-12-01 | Discharge: 2018-12-01 | Disposition: A | Discharge status: Home / Self Care | Payer: 59 | Source: Ambulatory Visit | Attending: Family Medicine | Admitting: Family Medicine

## 2018-12-01 ENCOUNTER — Other Ambulatory Visit: Payer: Self-pay | Admitting: Family Medicine

## 2018-12-01 ENCOUNTER — Other Ambulatory Visit: Payer: Self-pay

## 2018-12-01 DIAGNOSIS — M89319 Hypertrophy of bone, unspecified shoulder: Secondary | ICD-10-CM

## 2019-03-15 ENCOUNTER — Other Ambulatory Visit (HOSPITAL_BASED_OUTPATIENT_CLINIC_OR_DEPARTMENT_OTHER): Payer: Self-pay | Admitting: Family Medicine

## 2019-03-15 ENCOUNTER — Other Ambulatory Visit: Payer: Self-pay | Admitting: Family Medicine

## 2019-03-15 ENCOUNTER — Ambulatory Visit (INDEPENDENT_AMBULATORY_CARE_PROVIDER_SITE_OTHER): Payer: 59

## 2019-03-15 ENCOUNTER — Other Ambulatory Visit: Payer: Self-pay

## 2019-03-15 DIAGNOSIS — R109 Unspecified abdominal pain: Secondary | ICD-10-CM

## 2019-05-15 ENCOUNTER — Other Ambulatory Visit: Payer: Self-pay | Admitting: Urology

## 2019-05-30 ENCOUNTER — Encounter (HOSPITAL_BASED_OUTPATIENT_CLINIC_OR_DEPARTMENT_OTHER): Payer: Self-pay | Admitting: *Deleted

## 2019-05-30 ENCOUNTER — Other Ambulatory Visit: Payer: Self-pay

## 2019-05-30 NOTE — Progress Notes (Signed)
Spoke w/ via phone for pre-op interview---Susan [name] needs dos----     istat 8, ekg          [name] results------ COVID test ------06-02-2019 Arrive at -------900 am 06-06-2019 NPO after ------midnight Medications to take morning of surgery -----omeprazole, tamsulosin, hydrocodone prn Diabetic medication -----n/a Patient Special Instructions ----- Pre-Op special Istructions ----- Patient verbalized understanding of instructions that were given at this phone interview. Patient denies shortness of breath, chest pain, fever, cough a this phone interview.

## 2019-06-02 ENCOUNTER — Other Ambulatory Visit (HOSPITAL_COMMUNITY)
Admission: RE | Admit: 2019-06-02 | Discharge: 2019-06-02 | Disposition: A | Discharge status: Home / Self Care | Payer: 59 | Source: Ambulatory Visit | Attending: Urology | Admitting: Urology

## 2019-06-02 ENCOUNTER — Other Ambulatory Visit (HOSPITAL_COMMUNITY): Payer: 59

## 2019-06-02 DIAGNOSIS — Z01812 Encounter for preprocedural laboratory examination: Secondary | ICD-10-CM | POA: Diagnosis not present

## 2019-06-02 DIAGNOSIS — Z20828 Contact with and (suspected) exposure to other viral communicable diseases: Secondary | ICD-10-CM | POA: Insufficient documentation

## 2019-06-03 LAB — NOVEL CORONAVIRUS, NAA (HOSP ORDER, SEND-OUT TO REF LAB; TAT 18-24 HRS): SARS-CoV-2, NAA: NOT DETECTED

## 2019-06-05 NOTE — H&P (Signed)
Office Visit Report     05/25/2019   --------------------------------------------------------------------------------   Susan [name]  MRN: [id number]  DOB: 1961/06/20, 58 year old Female  SSN:    PRIMARY CARE:  Jarrett Soho, [place]  REFERRING:  J[place]rett [place]oho, [place]  PROVIDER:  Jerilee Field, M.D.  LOCATION:  [facility] [phone]     --------------------------------------------------------------------------------   CC: I have ureteral stone.  HPI: Susan [name] is a 58 year-old female established patient who is here for ureteral stone.  The problem is on the left side. She first stated noticing pain on 01/29/2019. This is her first kidney stone. She is currently having flank pain and back pain. She denies having groin pain, nausea, vomiting, fever, and chills. Pain is occuring on the left side.   She had pain in the left flank and left lower quadrant since about June 2020. She underwent a CT scan of the abdomen and pelvis on 03/15/2019 which revealed a 5 mm left UPJ stone with mild hydronephrosis (possibly visible, HU 314, 11 cm SSD). She's not sure if she's seen a stone pass. She has had red urine on a couple of occasions after riding the mower. She also is a Geneticist, molecular and walks a lot. No dysuria. She voids adequately.   Past medical history significant for Roux-en-Y gastric bypass and obesity done in 2018.   UA with 40-60 rbc and rare bac. +LE, -N.   F/u US 9/1, showed Mild hydronephrosis and a 1.0 cm calcification in the area of the renal pelvis. F/u KUB 05/05/2019 with left prox stone. She was brought today for repeat and eval but since then has been added on to the OR for cysto, left URS/HLL/stent. She is well. No gross hematuria. No stone passage. She gets occasional left flank pain.     ALLERGIES: No Allergies    MEDICATIONS: Keflex 500 mg capsule 1 capsule PO Q HS  Omeprazole 20 mg tablet, delayed release  Tamsulosin Hcl 0.4 mg capsule  Biotin   Calcium Citrate  One A Day  Telmisartan 40 mg tablet  Vitamin D3     GU PSH: No GU PSH    NON-GU PSH: Bilateral Tubal Ligation Cholecystectomy (laparoscopic) Gastric bypass Knee replacement, Bilateral Tonsillectomy     GU PMH: Ureteral calculus - 05/05/2019, - 04/21/2019 Gross hematuria - 04/21/2019    NON-GU PMH: Diabetes Type 2 Hypercholesterolemia Hypertension    FAMILY HISTORY: Cancer - Father Death of family member - Father    Notes: 2 daughters   SOCIAL HISTORY: Marital Status: Divorced Preferred Language: English; Ethnicity: Not Hispanic Or Latino; Race: White Current Smoking Status: Patient has never smoked.   Tobacco Use Assessment Completed: Used Tobacco in last 30 days? Has never drank.  Does not drink caffeine. Patient's occupation Radio broadcast assistant.    REVIEW OF SYSTEMS:    GU Review Female:   Patient denies frequent urination, hard to postpone urination, burning /pain with urination, get up at night to urinate, leakage of urine, stream starts and stops, trouble starting your stream, have to strain to urinate, and being pregnant.  Gastrointestinal (Upper):   Patient denies nausea, vomiting, and indigestion/ heartburn.  Gastrointestinal (Lower):   Patient denies diarrhea and constipation.  Constitutional:   Patient denies fever, night sweats, weight loss, and fatigue.  Skin:   Patient denies skin rash/ lesion and itching.  Eyes:   Patient denies double vision and blurred vision.  Ears/ Nose/ Throat:   Patient denies sore throat and  sinus problems.  Hematologic/Lymphatic:   Patient denies swollen glands and easy bruising.  Cardiovascular:   Patient denies leg swelling and chest pains.  Respiratory:   Patient denies cough and shortness of breath.  Endocrine:   Patient denies excessive thirst.  Musculoskeletal:   Patient denies back pain and joint pain.  Neurological:   Patient denies headaches and dizziness.  Psychologic:   Patient denies  depression and anxiety.   VITAL SIGNS:      05/25/2019 09:39 AM  Weight 234 lb / 106.14 kg  Height 69 in / 175.26 cm  BP 146/92 mmHg  Pulse 57 /min  Temperature 97.3 F / 36.2 C  BMI 34.6 kg/m   MULTI-SYSTEM PHYSICAL EXAMINATION:    Constitutional: Well-nourished. No physical deformities. Normally developed. Good grooming.  Neck: Neck symmetrical, not swollen. Normal tracheal position.  Respiratory: No labored breathing, no use of accessory muscles.   Cardiovascular: Normal temperature, normal extremity pulses, no swelling, no varicosities.  Neurologic / Psychiatric: Oriented to time, oriented to place, oriented to person. No depression, no anxiety, no agitation.  Gastrointestinal: No mass, no tenderness, no rigidity, non obese abdomen.     PAST DATA REVIEWED:  Source Of History:  Patient  X-Ray Review: KUB: Reviewed Films. 04/2019 C.T. Abdomen/Pelvis: Reviewed Films. 02/2019    PROCEDURES:          Urinalysis w/Scope Dipstick Dipstick Cont'd Micro  Color: Yellow Bilirubin: Neg mg/dL WBC/hpf: 6 - 16/XWR10/hpf  Appearance: Slightly Cloudy Ketones: Neg mg/dL RBC/hpf: 20 - 60/AVW40/hpf  Specific Gravity: 1.020 Blood: 3+ ery/uL Bacteria: Rare (0-9/hpf)  pH: 7.0 Protein: 1+ mg/dL Cystals: NS (Not Seen)  Glucose: Neg mg/dL Urobilinogen: 0.2 mg/dL Casts: NS (Not Seen)    Nitrites: Neg Trichomonas: Not Present    Leukocyte Esterase: 2+ leu/uL Mucous: Not Present      Epithelial Cells: 6 - 10/hpf      Yeast: NS (Not Seen)      Sperm: Not Present    Notes: Renal tubular epithelials observed; Clue cells observed.    ASSESSMENT:      ICD-10 Details  1 GU:   Ureteral calculus - N20.1   2   Acute Cystitis/UTI - N30.00    PLAN:           Orders Labs Urine Culture          Schedule Return Visit/Planned Activity: Keep Scheduled Appointment - Schedule Surgery          Document Letter(s):  Created for Patient: Clinical Summary         Notes:   left UPJ stone - I discussed with Apryle  the nature r/b/a to cysto, left URS, HLL stent . We discuss she may need a staged procedure if it is difficult to gain retrograde access. Discussed ureteral injury among other risks. All questions answered. I sent urine for culture as a precaution. She is on cephalexin po QHS after taking a tx dose for + cx.   cc: PA Wharton         Next Appointment:      Next Appointment: 06/06/2019 11:00 AM    Appointment Type: Surgery     Location: [facility] [phone]    Provider: Jerilee FieldMatthew Braylie Badami, M.D.    Reason for Visit: NE/OP CYSTO, LT RPG, LT URS HLL, LT RU STENT      * Signed by Jerilee FieldMatthew Toretto Tingler, M.D. on 05/28/19 at 9:07 PM (EDT)*     The information contained in this medical  record document is considered private and confidential patient information. This information can only be used for the medical diagnosis and/or medical services that are being provided by the patient's selected caregivers. This information can only be distributed outside of the patient's care if the patient agrees and signs waivers of authorization for this information to be sent to an outside source or route.  Addendum:  She was on  Nightly cephalexin after treatment for a pansensitive Klebsiella.  A  Repeat urine culture which was positive again for Klebsiella so I sent nitrofurantoin for her to start on 06/02/2019.

## 2019-06-06 ENCOUNTER — Ambulatory Visit (HOSPITAL_BASED_OUTPATIENT_CLINIC_OR_DEPARTMENT_OTHER): Payer: 59 | Admitting: Anesthesiology

## 2019-06-06 ENCOUNTER — Encounter (HOSPITAL_BASED_OUTPATIENT_CLINIC_OR_DEPARTMENT_OTHER): Payer: Self-pay

## 2019-06-06 ENCOUNTER — Ambulatory Visit (HOSPITAL_BASED_OUTPATIENT_CLINIC_OR_DEPARTMENT_OTHER)
Admission: RE | Admit: 2019-06-06 | Discharge: 2019-06-06 | Disposition: A | Discharge status: Home / Self Care | Payer: 59 | Attending: Urology | Admitting: Urology

## 2019-06-06 ENCOUNTER — Encounter (HOSPITAL_BASED_OUTPATIENT_CLINIC_OR_DEPARTMENT_OTHER)
Admission: RE | Disposition: A | Discharge status: Home / Self Care | Payer: Self-pay | Source: Home / Self Care | Attending: Urology

## 2019-06-06 DIAGNOSIS — E119 Type 2 diabetes mellitus without complications: Secondary | ICD-10-CM | POA: Diagnosis not present

## 2019-06-06 DIAGNOSIS — Z9884 Bariatric surgery status: Secondary | ICD-10-CM | POA: Insufficient documentation

## 2019-06-06 DIAGNOSIS — I10 Essential (primary) hypertension: Secondary | ICD-10-CM | POA: Diagnosis not present

## 2019-06-06 DIAGNOSIS — N132 Hydronephrosis with renal and ureteral calculous obstruction: Secondary | ICD-10-CM | POA: Diagnosis not present

## 2019-06-06 DIAGNOSIS — Z6834 Body mass index (BMI) 34.0-34.9, adult: Secondary | ICD-10-CM | POA: Diagnosis not present

## 2019-06-06 DIAGNOSIS — Z79899 Other long term (current) drug therapy: Secondary | ICD-10-CM | POA: Diagnosis not present

## 2019-06-06 DIAGNOSIS — K219 Gastro-esophageal reflux disease without esophagitis: Secondary | ICD-10-CM | POA: Diagnosis not present

## 2019-06-06 DIAGNOSIS — N200 Calculus of kidney: Secondary | ICD-10-CM | POA: Diagnosis present

## 2019-06-06 DIAGNOSIS — E669 Obesity, unspecified: Secondary | ICD-10-CM | POA: Diagnosis not present

## 2019-06-06 DIAGNOSIS — Z96653 Presence of artificial knee joint, bilateral: Secondary | ICD-10-CM | POA: Insufficient documentation

## 2019-06-06 HISTORY — DX: Personal history of other endocrine, nutritional and metabolic disease: Z86.39

## 2019-06-06 HISTORY — PX: HOLMIUM LASER APPLICATION: SHX5852

## 2019-06-06 HISTORY — DX: Type 2 diabetes mellitus without complications: E11.9

## 2019-06-06 HISTORY — DX: Calculus of ureter: N20.1

## 2019-06-06 HISTORY — DX: Essential (primary) hypertension: I10

## 2019-06-06 HISTORY — PX: CYSTOSCOPY/URETEROSCOPY/HOLMIUM LASER/STENT PLACEMENT: SHX6546

## 2019-06-06 LAB — POCT I-STAT, CHEM 8
BUN: 15 mg/dL (ref 6–20)
Calcium, Ion: 1.28 mmol/L (ref 1.15–1.40)
Chloride: 103 mmol/L (ref 98–111)
Creatinine, Ser: 0.5 mg/dL (ref 0.44–1.00)
Glucose, Bld: 85 mg/dL (ref 70–99)
HCT: 34 % — ABNORMAL LOW (ref 36.0–46.0)
Hemoglobin: 11.6 g/dL — ABNORMAL LOW (ref 12.0–15.0)
Potassium: 4.1 mmol/L (ref 3.5–5.1)
Sodium: 141 mmol/L (ref 135–145)
TCO2: 27 mmol/L (ref 22–32)

## 2019-06-06 LAB — GLUCOSE, CAPILLARY: Glucose-Capillary: 101 mg/dL — ABNORMAL HIGH (ref 70–99)

## 2019-06-06 SURGERY — CYSTOSCOPY/URETEROSCOPY/HOLMIUM LASER/STENT PLACEMENT
Anesthesia: General | Site: Renal | Laterality: Left

## 2019-06-06 MED ORDER — FENTANYL CITRATE (PF) 100 MCG/2ML IJ SOLN
INTRAMUSCULAR | Status: DC | PRN
  Administered 2019-06-06 (×2): 25 ug via INTRAVENOUS

## 2019-06-06 MED ORDER — MIDAZOLAM HCL 5 MG/5ML IJ SOLN
INTRAMUSCULAR | Status: DC | PRN
  Administered 2019-06-06 (×2): 1 mg via INTRAVENOUS

## 2019-06-06 MED ORDER — PROPOFOL 10 MG/ML IV BOLUS
INTRAVENOUS | Status: DC | PRN
  Administered 2019-06-06: 200 mg via INTRAVENOUS

## 2019-06-06 MED ORDER — ONDANSETRON HCL 4 MG/2ML IJ SOLN
INTRAMUSCULAR | Status: DC | PRN
  Administered 2019-06-06: 4 mg via INTRAVENOUS

## 2019-06-06 MED ORDER — NITROFURANTOIN MONOHYD MACRO 100 MG PO CAPS: 100.0000 mg | ORAL_CAPSULE | Freq: Every day | ORAL | 0 refills | Status: AC

## 2019-06-06 MED ORDER — DEXAMETHASONE SODIUM PHOSPHATE 10 MG/ML IJ SOLN
INTRAMUSCULAR | Status: AC
  Filled 2019-06-06: qty 1

## 2019-06-06 MED ORDER — DEXAMETHASONE SODIUM PHOSPHATE 4 MG/ML IJ SOLN
INTRAMUSCULAR | Status: DC | PRN
  Administered 2019-06-06: 10 mg via INTRAVENOUS

## 2019-06-06 MED ORDER — IOHEXOL 300 MG/ML  SOLN
INTRAMUSCULAR | Status: DC | PRN
  Administered 2019-06-06: 6 mL

## 2019-06-06 MED ORDER — CEFAZOLIN SODIUM-DEXTROSE 2-4 GM/100ML-% IV SOLN
INTRAVENOUS | Status: AC
  Filled 2019-06-06: qty 100

## 2019-06-06 MED ORDER — KETOROLAC TROMETHAMINE 30 MG/ML IJ SOLN
30.0000 mg | Freq: Once | INTRAMUSCULAR | Status: AC
  Administered 2019-06-06: 30 mg via INTRAVENOUS
  Filled 2019-06-06: qty 1

## 2019-06-06 MED ORDER — CEFAZOLIN SODIUM-DEXTROSE 2-4 GM/100ML-% IV SOLN
2.0000 g | Freq: Once | INTRAVENOUS | Status: AC
  Administered 2019-06-06: 2 g via INTRAVENOUS
  Filled 2019-06-06: qty 100

## 2019-06-06 MED ORDER — SODIUM CHLORIDE 0.9 % IR SOLN
Status: DC | PRN
  Administered 2019-06-06: 3000 mL

## 2019-06-06 MED ORDER — ONDANSETRON HCL 4 MG/2ML IJ SOLN
4.0000 mg | Freq: Once | INTRAMUSCULAR | Status: DC | PRN
  Filled 2019-06-06: qty 2

## 2019-06-06 MED ORDER — EPHEDRINE 5 MG/ML INJ
INTRAVENOUS | Status: AC
  Filled 2019-06-06: qty 10

## 2019-06-06 MED ORDER — FENTANYL CITRATE (PF) 100 MCG/2ML IJ SOLN
INTRAMUSCULAR | Status: AC
  Filled 2019-06-06: qty 2

## 2019-06-06 MED ORDER — ONDANSETRON HCL 4 MG/2ML IJ SOLN
INTRAMUSCULAR | Status: AC
  Filled 2019-06-06: qty 2

## 2019-06-06 MED ORDER — LIDOCAINE 2% (20 MG/ML) 5 ML SYRINGE
INTRAMUSCULAR | Status: DC | PRN
  Administered 2019-06-06: 60 mg via INTRAVENOUS

## 2019-06-06 MED ORDER — LIDOCAINE 2% (20 MG/ML) 5 ML SYRINGE
INTRAMUSCULAR | Status: AC
  Filled 2019-06-06: qty 5

## 2019-06-06 MED ORDER — KETOROLAC TROMETHAMINE 30 MG/ML IJ SOLN
INTRAMUSCULAR | Status: AC
  Filled 2019-06-06: qty 1

## 2019-06-06 MED ORDER — PROPOFOL 10 MG/ML IV BOLUS
INTRAVENOUS | Status: AC
  Filled 2019-06-06: qty 20

## 2019-06-06 MED ORDER — FENTANYL CITRATE (PF) 100 MCG/2ML IJ SOLN
25.0000 ug | INTRAMUSCULAR | Status: DC | PRN
  Filled 2019-06-06: qty 1

## 2019-06-06 MED ORDER — LACTATED RINGERS IV SOLN
INTRAVENOUS | Status: DC
  Administered 2019-06-06: 08:00:00 via INTRAVENOUS
  Filled 2019-06-06: qty 1000

## 2019-06-06 MED ORDER — OXYCODONE HCL 5 MG PO TABS
5.0000 mg | ORAL_TABLET | Freq: Once | ORAL | Status: DC | PRN
  Filled 2019-06-06: qty 1

## 2019-06-06 MED ORDER — MIDAZOLAM HCL 2 MG/2ML IJ SOLN
INTRAMUSCULAR | Status: AC
  Filled 2019-06-06: qty 2

## 2019-06-06 MED ORDER — OXYCODONE HCL 5 MG/5ML PO SOLN
5.0000 mg | Freq: Once | ORAL | Status: DC | PRN
  Filled 2019-06-06: qty 5

## 2019-06-06 SURGICAL SUPPLY — 23 items
BAG DRAIN URO-CYSTO SKYTR STRL (DRAIN) ×2 IMPLANT
CATH URET 5FR 28IN CONE TIP (BALLOONS)
CATH URET 5FR 28IN OPEN ENDED (CATHETERS) ×2 IMPLANT
CATH URET 5FR 70CM CONE TIP (BALLOONS) IMPLANT
CATH URET DUAL LUMEN 6-10FR 50 (CATHETERS) IMPLANT
CLOTH BEACON ORANGE TIMEOUT ST (SAFETY) ×2 IMPLANT
FIBER LASER TRAC TIP (UROLOGICAL SUPPLIES) ×2 IMPLANT
GLOVE BIO SURGEON STRL SZ7.5 (GLOVE) ×2 IMPLANT
GLOVE BIO SURGEON STRL SZ8 (GLOVE) IMPLANT
GLOVE BIOGEL PI IND STRL 6.5 (GLOVE) ×2 IMPLANT
GLOVE BIOGEL PI INDICATOR 6.5 (GLOVE) ×2
GOWN STRL REUS W/TWL LRG LVL3 (GOWN DISPOSABLE) ×4 IMPLANT
GUIDEWIRE ANG ZIPWIRE 038X150 (WIRE) ×2 IMPLANT
GUIDEWIRE STR DUAL SENSOR (WIRE) ×2 IMPLANT
IV NS IRRIG 3000ML ARTHROMATIC (IV SOLUTION) ×4 IMPLANT
KIT TURNOVER CYSTO (KITS) ×2 IMPLANT
MANIFOLD NEPTUNE II (INSTRUMENTS) ×2 IMPLANT
NS IRRIG 500ML POUR BTL (IV SOLUTION) ×2 IMPLANT
PACK CYSTO (CUSTOM PROCEDURE TRAY) ×2 IMPLANT
SHEATH URET ACCESS 12FR/35CM (UROLOGICAL SUPPLIES) ×2 IMPLANT
STENT URET 6FRX26 CONTOUR (STENTS) ×2 IMPLANT
TUBE CONNECTING 12X1/4 (SUCTIONS) ×2 IMPLANT
TUBING UROLOGY SET (TUBING) ×2 IMPLANT

## 2019-06-06 NOTE — Anesthesia Postprocedure Evaluation (Signed)
Anesthesia Post Note  Patient: Susan [name]  Procedure(s) Performed: CYSTOSCOPY/URETEROSCOPY RETROGRADE /HOLMIUM LASER/STENT PLACEMENT (Left Renal) HOLMIUM LASER APPLICATION (Left Renal)     Patient location during evaluation: PACU Anesthesia Type: General Level of consciousness: awake and alert Pain management: pain level controlled Vital Signs Assessment: post-procedure vital signs reviewed and stable Respiratory status: spontaneous breathing, nonlabored ventilation and respiratory function stable Cardiovascular status: blood pressure returned to baseline and stable Postop Assessment: no apparent nausea or vomiting Anesthetic complications: no    Last Vitals:  Vitals:   06/06/19 1049 06/06/19 1123  BP: (!) 172/84 (!) 159/82  Pulse: (!) 51   Resp: 14   Temp: 36.8 C   SpO2: 98%     Last Pain:  Vitals:   06/06/19 1049  TempSrc:   PainSc: 2                  Lidia Collum

## 2019-06-06 NOTE — Anesthesia Preprocedure Evaluation (Addendum)
Anesthesia Evaluation  Patient identified by MRN, date of birth, ID band Patient awake    Reviewed: Allergy & Precautions, NPO status , Patient's Chart, lab work & pertinent test results  History of Anesthesia Complications Negative for: history of anesthetic complications  Airway Mallampati: II  TM Distance: >3 FB Neck ROM: Full    Dental  (+) Teeth Intact   Pulmonary neg pulmonary ROS,    Pulmonary exam normal        Cardiovascular hypertension, Pt. on medications Normal cardiovascular exam     Neuro/Psych negative neurological ROS  negative psych ROS   GI/Hepatic Neg liver ROS, GERD  ,  Endo/Other  diabetes, Well Controlled  Renal/GU Renal disease (ureteral stone)  negative genitourinary   Musculoskeletal negative musculoskeletal ROS (+)   Abdominal   Peds  Hematology negative hematology ROS (+)   Anesthesia Other Findings   Reproductive/Obstetrics                            Anesthesia Physical Anesthesia Plan  ASA: II  Anesthesia Plan: General   Post-op Pain Management:    Induction: Intravenous  PONV Risk Score and Plan: 3 and Ondansetron, Dexamethasone, Treatment may vary due to age or medical condition and Midazolam  Airway Management Planned: LMA  Additional Equipment: None  Intra-op Plan:   Post-operative Plan: Extubation in OR  Informed Consent: I have reviewed the patients History and Physical, chart, labs and discussed the procedure including the risks, benefits and alternatives for the proposed anesthesia with the patient or authorized representative who has indicated his/her understanding and acceptance.     Dental advisory given  Plan Discussed with:   Anesthesia Plan Comments:        Anesthesia Quick Evaluation

## 2019-06-06 NOTE — Discharge Instructions (Signed)
Ureteral Stent Implantation, Care After This sheet gives you information about how to care for yourself after your procedure. Your health care provider may also give you more specific instructions. If you have problems or questions, contact your health care provider.  Removal of the stent: Remove the stent by pulling the string and the stent on Monday morning, June 12, 2019.  What can I expect after the procedure? After the procedure, it is common to have:  Nausea.  Mild pain when you urinate. You may feel this pain in your lower back or lower abdomen. The pain should stop within a few minutes after you urinate. This may last for up to 1 week.  A small amount of blood in your urine for several days. Follow these instructions at home: Medicines  Take over-the-counter and prescription medicines only as told by your health care provider.  If you were prescribed an antibiotic medicine, take it as told by your health care provider. Do not stop taking the antibiotic even if you start to feel better.  Do not drive for 24 hours if you were given a sedative during your procedure.  Ask your health care provider if the medicine prescribed to you requires you to avoid driving or using heavy machinery. Activity  Rest as told by your health care provider.  Avoid sitting for a long time without moving. Get up to take short walks every 1-2 hours. This is important to improve blood flow and breathing. Ask for help if you feel weak or unsteady.  Return to your normal activities as told by your health care provider. Ask your health care provider what activities are safe for you. General instructions   Watch for any blood in your urine. Call your health care provider if the amount of blood in your urine increases.  If you have a catheter: ? Follow instructions from your health care provider about taking care of your catheter and collection bag. ? Do not take baths, swim, or use a hot tub until  your health care provider approves. Ask your health care provider if you may take showers. You may only be allowed to take sponge baths.  Drink enough fluid to keep your urine pale yellow.  Do not use any products that contain nicotine or tobacco, such as cigarettes, e-cigarettes, and chewing tobacco. These can delay healing after surgery. If you need help quitting, ask your health care provider.  Keep all follow-up visits as told by your health care provider. This is important. Contact a health care provider if:  You have pain that gets worse or does not get better with medicine, especially pain when you urinate.  You have difficulty urinating.  You feel nauseous or you vomit repeatedly during a period of more than 2 days after the procedure. Get help right away if:  Your urine is dark red or has blood clots in it.  You are leaking urine (have incontinence).  The end of the stent comes out of your urethra.  You cannot urinate.  You have sudden, sharp, or severe pain in your abdomen or lower back.  You have a fever.  You have swelling or pain in your legs.  You have difficulty breathing. Summary  After the procedure, it is common to have mild pain when you urinate that goes away within a few minutes after you urinate. This may last for up to 1 week.  Watch for any blood in your urine. Call your health care provider if the amount  of blood in your urine increases.  Take over-the-counter and prescription medicines only as told by your health care provider.  Drink enough fluid to keep your urine pale yellow. This information is not intended to replace advice given to you by your health care provider. Make sure you discuss any questions you have with your health care provider. Document Released: 04/12/2013 Document Revised: 05/17/2018 Document Reviewed: 05/18/2018 Elsevier Patient Education  2020 [place] INSTRUCTIONS  Activity: Rest for the  remainder of the day.  Do not drive or operate equipment today.  You may resume normal activities in one to two days as instructed by your physician.   Meals: Drink plenty of liquids and eat light foods such as gelatin or soup this evening.  You may return to a normal meal plan tomorrow.  Return to Work: You may return to work in one to two days or as instructed by your physician.  Special Instructions / Symptoms: Call your physician if any of these symptoms occur:   -persistent or heavy bleeding  -bleeding which continues after first few urination  -large blood clots that are difficult to pass  -urine stream diminishes or stops completely  -fever equal to or higher than 101 degrees Farenheit.  -cloudy urine with a strong, foul odor  -severe pain  Females should always wipe from front to back after elimination.  You may feel some burning pain when you urinate.  This should disappear with time.  Applying moist heat to the lower abdomen or a hot tub bath may help relieve the pain. \  Follow-Up / Date of Return Visit to Your Physician: as instructed Call for an appointment to arrange follow-up.  Patient Signature:  ________________________________________________________  Nurse's Signature:  ________________________________________________________  Post Anesthesia Home Care Instructions  Activity: Get plenty of rest for the remainder of the day. A responsible individual must stay with you for 24 hours following the procedure.  For the next 24 hours, DO NOT: -Drive a car -Paediatric nurse -Drink alcoholic beverages -Take any medication unless instructed by your physician -Make any legal decisions or sign important papers.  Meals: Start with liquid foods such as gelatin or soup. Progress to regular foods as tolerated. Avoid greasy, spicy, heavy foods. If nausea and/or vomiting occur, drink only clear liquids until the nausea and/or vomiting subsides. Call your physician if  vomiting continues.  Special Instructions/Symptoms: Your throat may feel dry or sore from the anesthesia or the breathing tube placed in your throat during surgery. If this causes discomfort, gargle with warm salt water. The discomfort should disappear within 24 hours.  If you had a scopolamine patch placed behind your ear for the management of post- operative nausea and/or vomiting:  1. The medication in the patch is effective for 72 hours, after which it should be removed.  Wrap patch in a tissue and discard in the trash. Wash hands thoroughly with soap and water. 2. You may remove the patch earlier than 72 hours if you experience unpleasant side effects which may include dry mouth, dizziness or visual disturbances. 3. Avoid touching the patch. Wash your hands with soap and water after contact with the patch.

## 2019-06-06 NOTE — Transfer of Care (Signed)
Immediate Anesthesia Transfer of Care Note  Patient: Susan [name]  Procedure(s) Performed: CYSTOSCOPY/URETEROSCOPY RETROGRADE Dorene Ar LASER/STENT PLACEMENT (Left Renal) HOLMIUM LASER APPLICATION (Left Renal)  Patient Location: PACU  Anesthesia Type:General  Level of Consciousness: awake, alert  and oriented  Airway & Oxygen Therapy: Patient Spontanous Breathing and Patient connected to nasal cannula oxygen  Post-op Assessment: Report given to RN and Post -op Vital signs reviewed and stable  Post vital signs: Reviewed and stable  Last Vitals:  Vitals Value Taken Time  BP 146/88 06/06/19 0945  Temp    Pulse 64 06/06/19 0949  Resp 8 06/06/19 0949  SpO2 100 % 06/06/19 0949  Vitals shown include unvalidated device data.  Last Pain:  Vitals:   06/06/19 0730  TempSrc: Oral  PainSc: 1       Patients Stated Pain Goal: 5 (21/11/55 2080)  Complications: No apparent anesthesia complications

## 2019-06-06 NOTE — Interval H&P Note (Signed)
History and Physical Interval Note:  06/06/2019 8:42 AM  Susan [name]  has presented today for surgery, with the diagnosis of LEFT URETERAL CALCULUS.  The various methods of treatment have been discussed with the patient and family. After consideration of risks, benefits and other options for treatment, the patient has consented to  Procedure(s): CYSTOSCOPY/URETEROSCOPY/HOLMIUM LASER/STENT PLACEMENT (Left) as a surgical intervention.  The patient's history has been reviewed, patient examined, no change in status, stable for surgery.  She started nitrofurantoin on Friday for a positive urine culture but again has had no dysuria or fever.  She continues to have some colicky left flank pain and has not seen a stone pass.  I have reviewed the patient's chart and labs.  Questions were answered to the patient's satisfaction.  Discussed again she may need a staged procedure.  We also discussed she could have passed the stone and the stone may or may not be contributing to her pain.   Festus Aloe

## 2019-06-06 NOTE — Addendum Note (Signed)
Addendum  created 06/06/19 1241 by Suan Halter, CRNA   Charge Capture section accepted

## 2019-06-06 NOTE — Op Note (Signed)
Preoperative diagnosis: Left renal stone Postoperative diagnosis: Same  Procedure: Cystoscopy with left retrograde pyelogram, left ureteroscopy, holmium laser lithotripsy, left ureteral stent placement  Surgeon: Junious Silk  Anesthesia: General  Indication for procedure: Annelise is a 58 year old female with a symptomatic left renal stone.  She is continued to have left renal colic and dark urine.  Findings: On cystoscopy the urethra and the bladder were unremarkable.  There was no stone or foreign body in the bladder.     left retrograde pyelogram there was a single ureter single collecting system unit with some narrowing at the left UPJ and mild dilation of the collecting system with a filling defect consistent with the renal pelvic stone.  On ureteroscopy the left proximal ureter was narrow for a centimeter or 2 leading into the left renal pelvis with a narrow left UPJ but patent.  Stone was located in the left renal pelvis pushed into a left midpole calyx and fragmentation was excellent.  Description of procedure: After consent was obtained patient brought to the operating room.  After adequate anesthesia she is placed in lithotomy position and prepped and draped in the usual sterile fashion.  A timeout was performed to confirm the patient and procedure.  The cystoscope was passed per urethra and the bladder inspected.  The left ureteral orifice was cannulated with a 5 [place] open-ended catheter and left retrograde injection of contrast was performed.  I then passed a sensor wire into the collecting system and over the sensor wire passed the inner cannula of the access sheath to gauge the left ureter and it went without difficulty.  I then used the access sheath to place 2 wires and went adjacent to the Glidewire leaving it as the safety.  The dual channel digital ureteroscope was advanced and although the proximal ureter and UPJ were narrow it passed into the collecting system without difficulty  where the stone was located.  I pushed it into a midpole calyx and passed a 200 m laser fiber.  The stone was dusted at 0.3 and 40 and 1 and 10.  Fragmentation was excellent with 1 mm pieces remaining and most turning to dust.  The stone was not very dense except at its inferior core.  There were no stone fragments to basket and the collecting system was inspected again and no other stones noted.  The UPJ was inspected and although narrow noted to be normal without injury.  The access sheath was backloaded on the cystoscope and the ureter inspected on the way out and noted to be normal without stone fragment or injury.  The wire was backloaded on the cystoscope and a 6 x 26 cm stent advanced.  The wire was removed with a good coil seen in the renal pelvis and a good coil in the bladder.  The bladder was drained and the scope removed.  I left a string on the stent.  She was awakened taken recovery room in stable condition.  Complications: None  Blood loss: Minimal  Specimens: None  Drains: 6 x 26 cm left ureteral stent with string  Disposition: Patient stable to PACU-per patient instructions I called Marin Olp and went over the procedure and f/u.

## 2019-06-06 NOTE — Anesthesia Procedure Notes (Signed)
Procedure Name: LMA Insertion Date/Time: 06/06/2019 8:56 AM Performed by: Hewitt Blade, CRNA Pre-anesthesia Checklist: Patient identified, Emergency Drugs available, Suction available and Patient being monitored Patient Re-evaluated:Patient Re-evaluated prior to induction Oxygen Delivery Method: Circle system utilized Preoxygenation: Pre-oxygenation with 100% oxygen Induction Type: IV induction Ventilation: Mask ventilation without difficulty LMA: LMA inserted LMA Size: 4.0 Number of attempts: 1 Placement Confirmation: positive ETCO2 and breath sounds checked- equal and bilateral Tube secured with: Tape Dental Injury: Teeth and Oropharynx as per pre-operative assessment

## 2019-06-07 ENCOUNTER — Encounter (HOSPITAL_BASED_OUTPATIENT_CLINIC_OR_DEPARTMENT_OTHER): Payer: Self-pay | Admitting: Urology

## 2020-10-26 IMAGING — CT CT RENAL STONE PROTOCOL
2 of 4 series · 16 of 46 positions shown, 18 images · non-contrast
Comparison: None.

CLINICAL DATA: Right flank pain

EXAM:
CT ABDOMEN AND PELVIS WITHOUT CONTRAST
TECHNIQUE: Multidetector CT imaging of the abdomen and pelvis was performed
following the standard protocol without IV contrast.

[Series 2: axial st · axial · 0.86mm/px · z∈[-569,-94]mm · 13 of 103 slices shown, 15 images]
[im 4/103  soft-tissue]
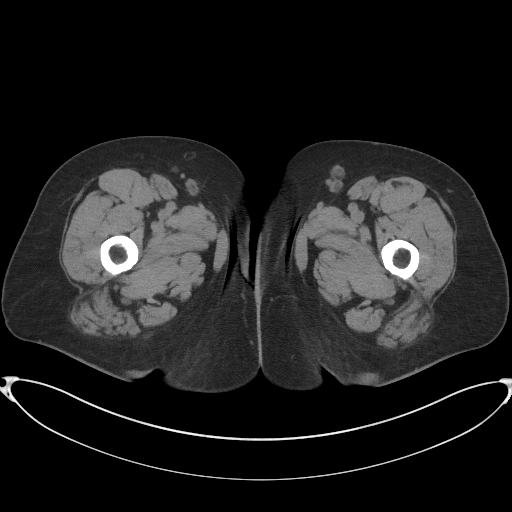
[im 4/103  bone]
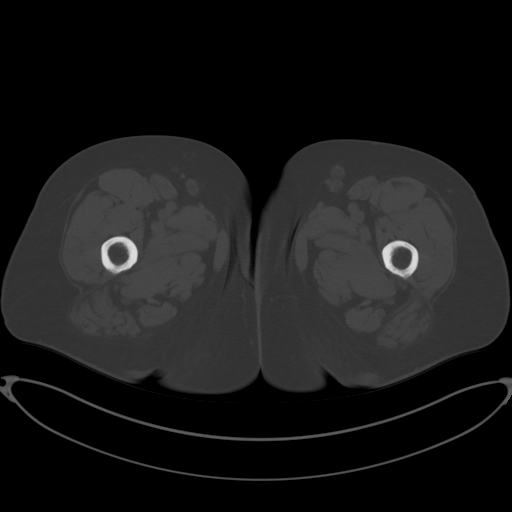
[im 12/103  soft-tissue]
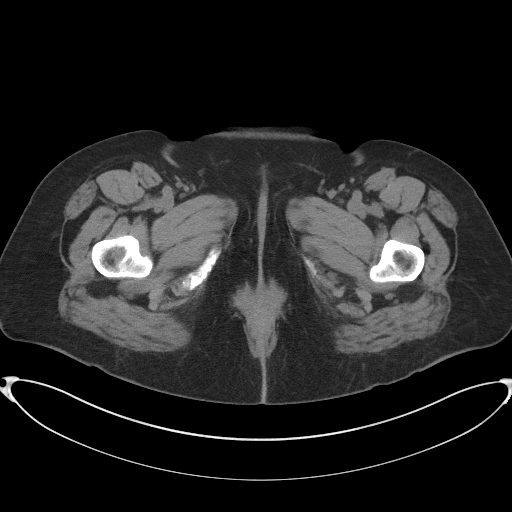
[im 20/103  soft-tissue]
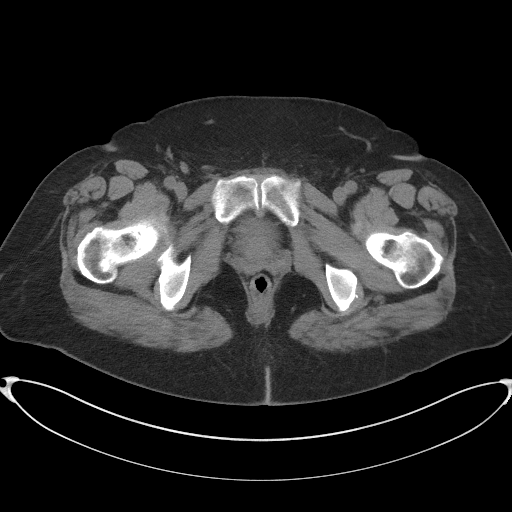
[im 28/103  soft-tissue]
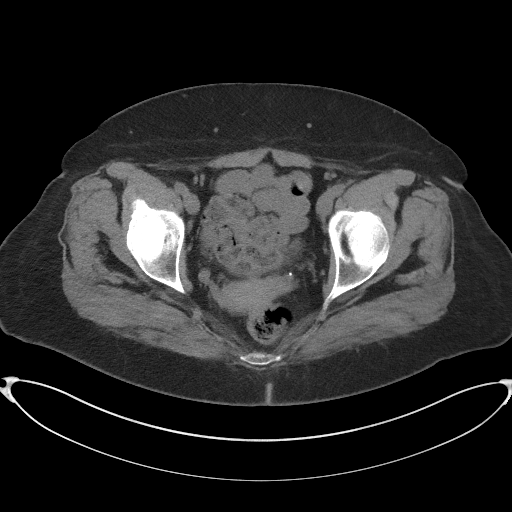
[im 36/103  soft-tissue]
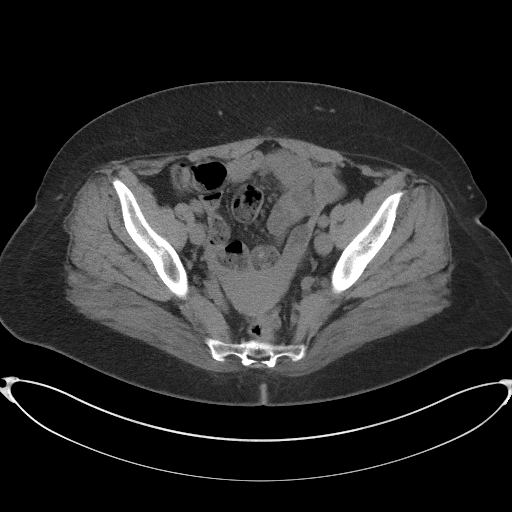
[im 44/103  soft-tissue]
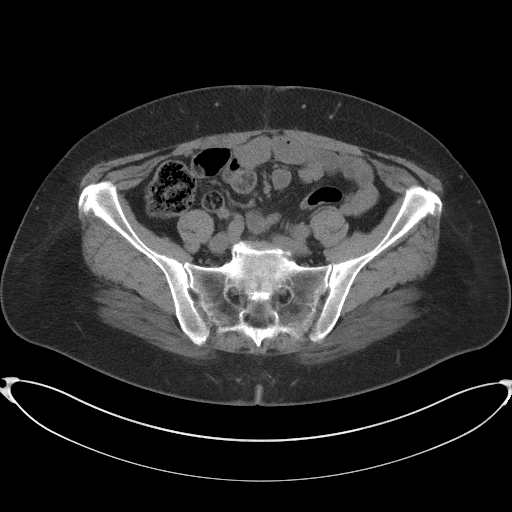
[im 52/103  soft-tissue]
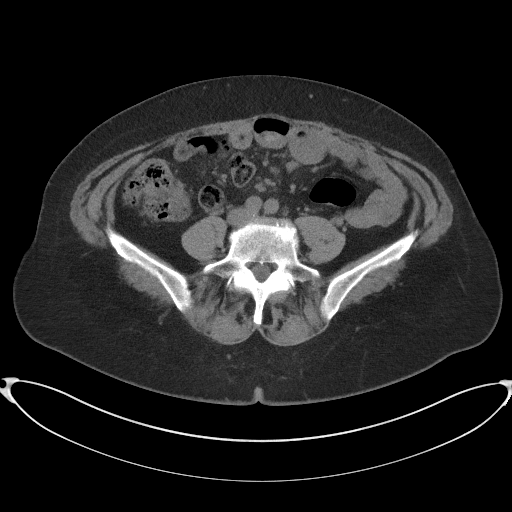
[im 59/103  soft-tissue]
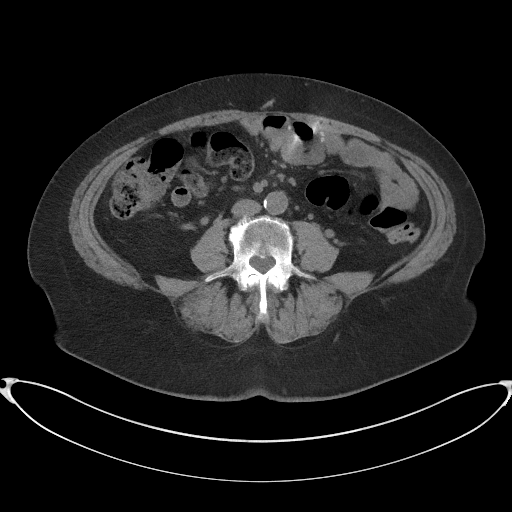
[im 67/103  soft-tissue]
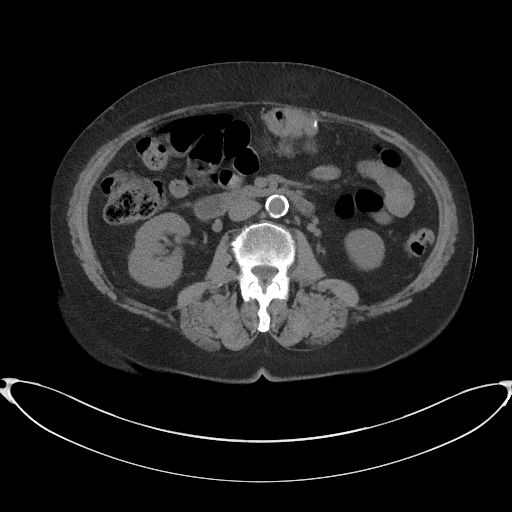
[im 67/103  bone]
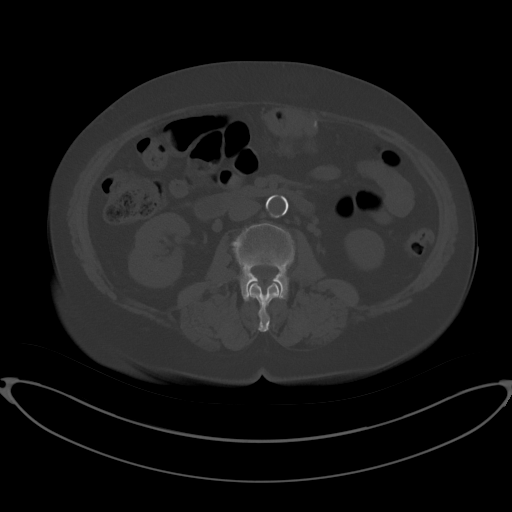
[im 75/103  soft-tissue]
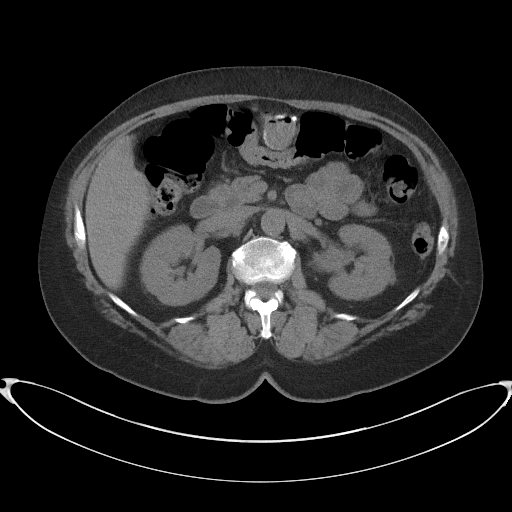
[im 83/103  soft-tissue]
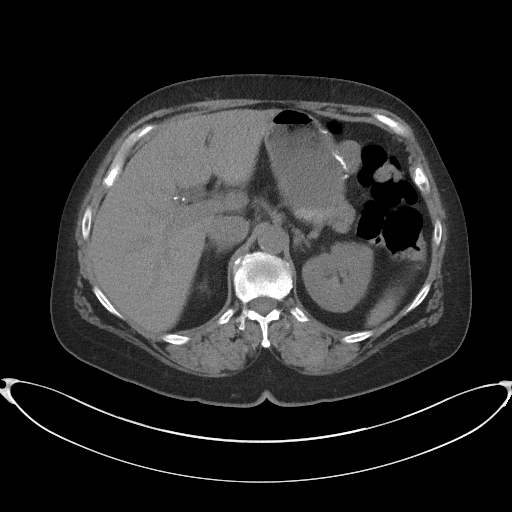
[im 91/103  soft-tissue]
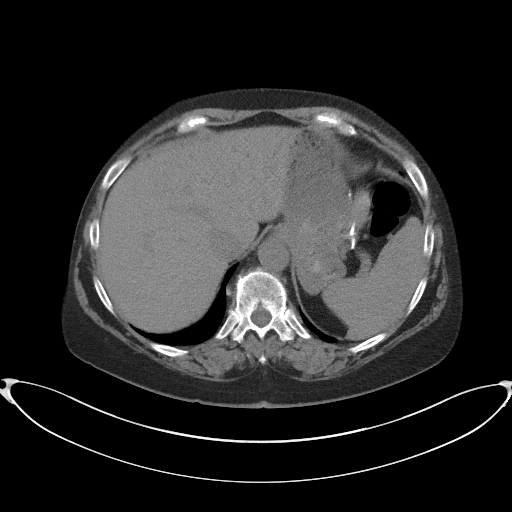
[im 99/103  soft-tissue]
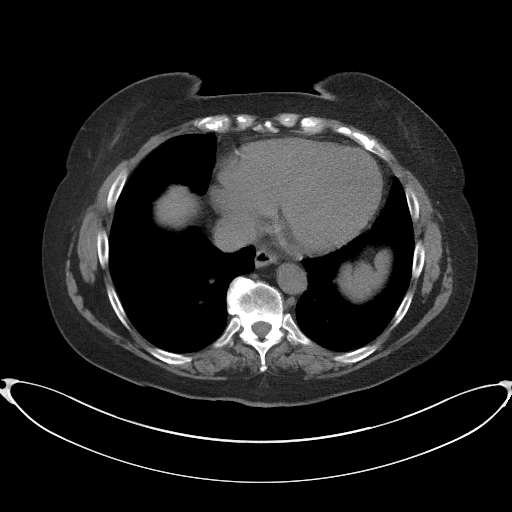

[Series 4: coronal st · coronal · 0.92mm/px · 3 of 99 slices shown]
[im 33/99  soft-tissue]
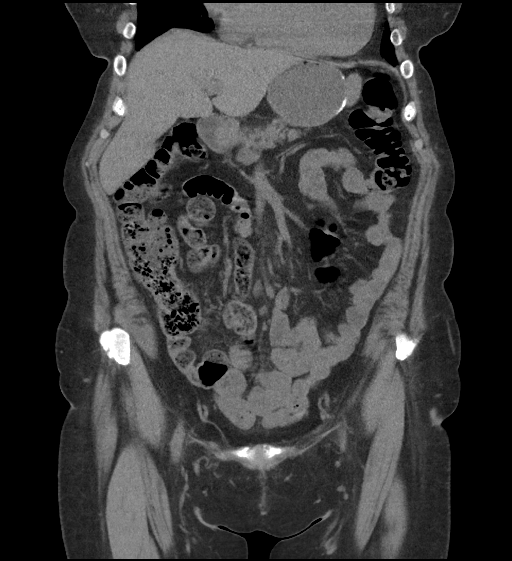
[im 44/99  soft-tissue]
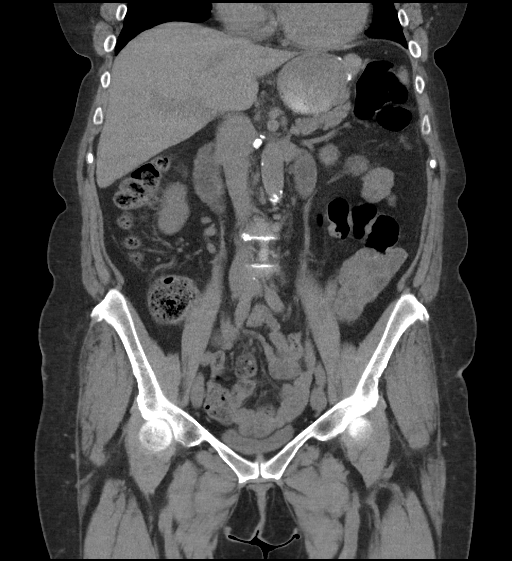
[im 55/99  soft-tissue]
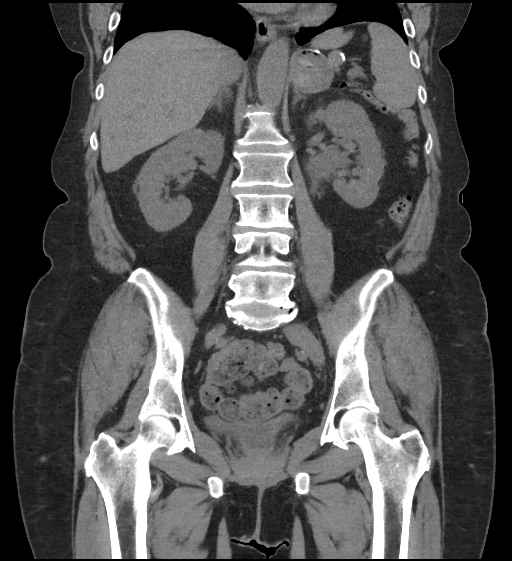

[16 of 46 positions shown; findings below may reference images not displayed]

FINDINGS: Lower chest: No acute abnormality.  Coronary artery calcifications.

Hepatobiliary: No focal liver abnormality is seen. Status post
cholecystectomy. No biliary dilatation.

Pancreas: Unremarkable. No pancreatic ductal dilatation or
surrounding inflammatory changes.

Spleen: Normal in size without significant abnormality.

Adrenals/Urinary Tract: Adrenal glands are unremarkable. Moderate
left-sided hydronephrosis with a 5 mm calculus in the left renal
pelvis. There is an abrupt caliber change at the ureteropelvic
junction without hydroureter (series 4, image 56). No evidence of
right-sided urinary tract calculus or hydronephrosis. Bladder is
unremarkable.

Stomach/Bowel: Status post Roux-en-Y gastric bypass. Appendix is
diminutive although normal in appearance. There are multiple loops
of fecalized distal small bowel, with stool present in the colon to
the rectum. There is a somewhat narrowed segment of transverse colon
adjacent Roux anastomosis site (series 2, image 39). No evidence of
bowel wall thickening, distention, or inflammatory changes.

Vascular/Lymphatic: Aortic atherosclerosis. No enlarged abdominal or
pelvic lymph nodes.

Reproductive: No mass or other significant abnormality.

Other: Small, fat midline epigastric hernia (series 2, image 34). No
abdominopelvic ascites.

Musculoskeletal: No acute or significant osseous findings.
IMPRESSION: 1. No definite CT findings to explain right flank pain. No evidence
of right-sided urinary tract calculus or hydronephrosis.

2. Moderate left-sided hydronephrosis with a 5 mm calculus in the
left renal pelvis. There is an abrupt caliber change at the
ureteropelvic junction without hydroureter (series 4, image 56).
Findings suggest a ureteropelvic junction stricture, which can be
further evaluated by nuclear scintigraphic Lasix renogram.

3. Status post Roux-en-Y gastric bypass. There are multiple loops of
fecalized distal small bowel, with stool present in the colon to the
rectum. There is a somewhat narrowed segment of transverse colon
adjacent Roux anastomosis site (series 2, image 39). Findings
suggest some degree of partial obstipation, although there is
transit of gas and stool excluding complete obstruction. This could
be further evaluated by colonoscopy if clinically appropriate.

4. Other chronic, incidental, and postoperative findings as detailed
above.
